# Patient Record
Sex: Female | Born: 1980 | Race: Black or African American | Hispanic: No | Marital: Single | Smoking: Never smoker
Health system: Southern US, Community
[De-identification: ages and names within clinical notes are randomized; demographics above are authoritative.]

## PROBLEM LIST (undated history)

## (undated) DIAGNOSIS — R350 Frequency of micturition: Secondary | ICD-10-CM

## (undated) DIAGNOSIS — M62838 Other muscle spasm: Secondary | ICD-10-CM

## (undated) DIAGNOSIS — Z87898 Personal history of other specified conditions: Secondary | ICD-10-CM

## (undated) DIAGNOSIS — K219 Gastro-esophageal reflux disease without esophagitis: Secondary | ICD-10-CM

## (undated) DIAGNOSIS — G4700 Insomnia, unspecified: Secondary | ICD-10-CM

## (undated) DIAGNOSIS — Z8709 Personal history of other diseases of the respiratory system: Secondary | ICD-10-CM

## (undated) DIAGNOSIS — M549 Dorsalgia, unspecified: Secondary | ICD-10-CM

## (undated) DIAGNOSIS — R531 Weakness: Secondary | ICD-10-CM

## (undated) DIAGNOSIS — R32 Unspecified urinary incontinence: Secondary | ICD-10-CM

## (undated) DIAGNOSIS — R351 Nocturia: Secondary | ICD-10-CM

## (undated) DIAGNOSIS — Z8669 Personal history of other diseases of the nervous system and sense organs: Secondary | ICD-10-CM

## (undated) DIAGNOSIS — G8929 Other chronic pain: Secondary | ICD-10-CM

---

## 2011-05-31 ENCOUNTER — Other Ambulatory Visit (HOSPITAL_COMMUNITY)
Admission: RE | Admit: 2011-05-31 | Discharge: 2011-05-31 | Disposition: A | Discharge status: Home / Self Care | Payer: Federal, State, Local not specified - PPO | Source: Ambulatory Visit | Attending: Obstetrics and Gynecology | Admitting: Obstetrics and Gynecology

## 2011-05-31 DIAGNOSIS — Z01419 Encounter for gynecological examination (general) (routine) without abnormal findings: Secondary | ICD-10-CM | POA: Insufficient documentation

## 2011-05-31 DIAGNOSIS — Z113 Encounter for screening for infections with a predominantly sexual mode of transmission: Secondary | ICD-10-CM | POA: Insufficient documentation

## 2011-05-31 DIAGNOSIS — Z1159 Encounter for screening for other viral diseases: Secondary | ICD-10-CM | POA: Insufficient documentation

## 2011-11-22 DIAGNOSIS — Z8709 Personal history of other diseases of the respiratory system: Secondary | ICD-10-CM

## 2011-11-22 HISTORY — DX: Personal history of other diseases of the respiratory system: Z87.09

## 2012-04-16 ENCOUNTER — Encounter: Payer: Self-pay | Admitting: Emergency Medicine

## 2012-04-16 ENCOUNTER — Emergency Department (INDEPENDENT_AMBULATORY_CARE_PROVIDER_SITE_OTHER)
Admission: EM | Admit: 2012-04-16 | Discharge: 2012-04-16 | Disposition: A | Discharge status: Home / Self Care | Payer: Federal, State, Local not specified - PPO | Source: Home / Self Care | Attending: Family Medicine | Admitting: Family Medicine

## 2012-04-16 DIAGNOSIS — H6690 Otitis media, unspecified, unspecified ear: Secondary | ICD-10-CM

## 2012-04-16 DIAGNOSIS — H6692 Otitis media, unspecified, left ear: Secondary | ICD-10-CM

## 2012-04-16 DIAGNOSIS — J069 Acute upper respiratory infection, unspecified: Secondary | ICD-10-CM

## 2012-04-16 DIAGNOSIS — J029 Acute pharyngitis, unspecified: Secondary | ICD-10-CM

## 2012-04-16 HISTORY — DX: Gastro-esophageal reflux disease without esophagitis: K21.9

## 2012-04-16 MED ORDER — GUAIFENESIN-CODEINE 100-10 MG/5ML PO SYRP: 10.0000 mL | ORAL_SOLUTION | Freq: Every day | ORAL | Status: AC

## 2012-04-16 MED ORDER — CLARITHROMYCIN 500 MG PO TABS: 500.0000 mg | ORAL_TABLET | Freq: Two times a day (BID) | ORAL | Status: AC

## 2012-04-16 NOTE — ED Provider Notes (Signed)
History     CSN: [id number]  Arrival date & time 04/16/12  1478   First MD Initiated Contact with Patient 04/16/12 [phone]      Chief Complaint  Patient presents with  . Sore Throat  . Cough  . Otalgia      HPI Comments: Patient complains of approximately 5 day history of gradually progressive URI symptoms beginning with a mild sore throat (now persistent), followed by progressive nasal congestion.  A cough started about 3 days ago.  Complains of fatigue and initial myalgias.  Cough is now worse at night and generally  productive during the day.  There has been no pleuritic pain, shortness of breath, or wheezes.  Today she developed a left earache with decreased hearing in the left ear.  She states that she often coughs until she gags.  She believes that her last tetanus immunization was before 2002.  The history is provided by the patient.    Past Medical History  Diagnosis Date  . Acid reflux     History reviewed. No pertinent past surgical history.  Family History  Problem Relation Age of Onset  . Hypertension Father     History  Substance Use Topics  . Smoking status: Never Smoker   . Smokeless tobacco: Not on file  . Alcohol Use: No    OB History    Grav Para Term Preterm Abortions TAB SAB Ect Mult Living                  Review of Systems + sore throat + cough No pleuritic pain No wheezing + nasal congestion + post-nasal drainage No sinus pain/pressure No itchy/red eyes + left earache No hemoptysis No SOB No fever/chills No nausea No vomiting No abdominal pain No diarrhea No urinary symptoms No skin rashes + fatigue + myalgias + headache Used OTC meds without relief (Mucinex) Allergies  Review of patient's allergies indicates no known allergies.  Home Medications   Current Outpatient Rx  Name Route Sig Dispense Refill  . CLARITHROMYCIN 500 MG PO TABS Oral Take 1 tablet (500 mg total) by mouth 2 (two) times daily. 14 tablet 0  .  GUAIFENESIN-CODEINE 100-10 MG/5ML PO SYRP Oral Take 10 mLs by mouth at bedtime. for cough 120 mL 0    BP 148/82  Pulse 84  Temp(Src) 98.4 F (36.9 C) (Oral)  Resp 16  Ht 5\' 8"  (1.727 m)  Wt 227 lb (102.967 kg)  BMI 34.52 kg/m2  SpO2 100%  LMP 03/12/2012  Physical Exam Nursing notes and Vital Signs reviewed. Appearance:  Patient appears healthy, stated age, and in no acute distress.  Patient is obese (BMI 34.6) Eyes:  Pupils are equal, round, and reactive to light and accomodation.  Extraocular movement is intact.  Conjunctivae are not inflamed  Ears:  Canals normal.  Right tympanic membrane normal.  Left tympanic membrane red and bulging. Nose:  Moderately congested turbinates.  No sinus tenderness.   Pharynx:  Minimal erythema Neck:  Supple.  Slightly tender shotty anterior/posterior nodes are palpated bilaterally  Lungs:  Clear to auscultation.  Breath sounds are equal.  Heart:  Regular rate and rhythm without murmurs, rubs, or gallops.  Abdomen:  Nontender without masses or hepatosplenomegaly.  Bowel sounds are present.  No CVA or flank tenderness.  Extremities:  No edema.  No calf tenderness Skin:  No rash present.   ED Course  Procedures none   Labs Reviewed  POCT RAPID STREP A (OFFICE) negative  1. Left otitis media   2. Acute pharyngitis   3. Acute upper respiratory infections of unspecified site.  ? Pertussis      MDM  Begin Biaxin.  Cough suppressant at bedtime.  Take Mucinex D (guaifenesin with decongestant) twice daily for congestion (or take Mucinex plus Sudafed).  Increase fluid intake, rest. May use Afrin nasal spray (or generic oxymetazoline) twice daily for about 5 days.  Also recommend using saline nasal spray several times daily and saline nasal irrigation (AYR is a common brand) Stop all antihistamines for now, and other non-prescription cough/cold preparations. May take Ibuprofen 200mg , 4 tabs every 8 hours with food for sore throat, headache,  etc. Recommend a Tdap when well.  Followup with Family Doctor if not improved in one week.         Lattie Haw, MD 04/16/12 [phone]

## 2012-04-16 NOTE — ED Notes (Signed)
Has had congestion, cough, sore throat and left ear pain x 5 days. No Flu vaccination this season.

## 2012-04-16 NOTE — Discharge Instructions (Signed)
Take Mucinex D (guaifenesin with decongestant) twice daily for congestion (or take Mucinex plus Sudafed).  Increase fluid intake, rest. May use Afrin nasal spray (or generic oxymetazoline) twice daily for about 5 days.  Also recommend using saline nasal spray several times daily and saline nasal irrigation (AYR is a common brand) Stop all antihistamines for now, and other non-prescription cough/cold preparations. May take Ibuprofen 200mg , 4 tabs every 8 hours with food for sore throat, headache, etc. Recommend a Tdap when well.

## 2012-04-18 ENCOUNTER — Telehealth: Payer: Self-pay | Admitting: *Deleted

## 2012-04-18 ENCOUNTER — Telehealth: Payer: Self-pay

## 2012-04-18 NOTE — ED Notes (Signed)
Left a message on voice mail asking how patient is feeling and advising to call back with any questions or concerns.  

## 2012-04-18 NOTE — ED Notes (Signed)
Spoke to pt in regards to her previous phone call. Per dr Cathren Harsh use OTC monistat for vaginal d/c. He also recommends that she try to complete the ABT since her lip swelling is intermittent and not severe. Advised her she could take benadryl with the ABT if needed. If she worsens or fails to improve he recommends that she come in for a f/u visit tomorrow. Pt agrees.

## 2012-04-18 NOTE — ED Notes (Signed)
Pt called back c/o swelling on her upper lip and thick yellow/ white vaginal d/c. She believes this may be related to the Biaxin. She states that she is some better, still has a cough. [facility] [place].

## 2012-05-01 ENCOUNTER — Encounter: Payer: Self-pay | Admitting: Family Medicine

## 2012-05-01 ENCOUNTER — Ambulatory Visit (INDEPENDENT_AMBULATORY_CARE_PROVIDER_SITE_OTHER): Payer: Federal, State, Local not specified - PPO | Admitting: Family Medicine

## 2012-05-01 VITALS — BP 135/89 | HR 63 | Ht 68.0 in | Wt 231.0 lb

## 2012-05-01 DIAGNOSIS — M533 Sacrococcygeal disorders, not elsewhere classified: Secondary | ICD-10-CM

## 2012-05-01 DIAGNOSIS — M79609 Pain in unspecified limb: Secondary | ICD-10-CM

## 2012-05-01 DIAGNOSIS — J019 Acute sinusitis, unspecified: Secondary | ICD-10-CM

## 2012-05-01 DIAGNOSIS — M79604 Pain in right leg: Secondary | ICD-10-CM

## 2012-05-01 DIAGNOSIS — J40 Bronchitis, not specified as acute or chronic: Secondary | ICD-10-CM

## 2012-05-01 MED ORDER — AMOXICILLIN-POT CLAVULANATE 875-125 MG PO TABS: 1.0000 | ORAL_TABLET | Freq: Two times a day (BID) | ORAL | Status: AC

## 2012-05-01 MED ORDER — ORPHENADRINE CITRATE ER 100 MG PO TB12: 100.0000 mg | ORAL_TABLET | Freq: Two times a day (BID) | ORAL | Status: AC

## 2012-05-01 MED ORDER — HYDROCOD POLST-CHLORPHEN POLST 10-8 MG/5ML PO LQCR: 5.0000 mL | Freq: Two times a day (BID) | ORAL | Status: DC | PRN

## 2012-05-01 MED ORDER — FEXOFENADINE-PSEUDOEPHED ER 180-240 MG PO TB24: 1.0000 | ORAL_TABLET | Freq: Every day | ORAL | Status: AC

## 2012-05-01 MED ORDER — MELOXICAM 15 MG PO TABS: 15.0000 mg | ORAL_TABLET | Freq: Every day | ORAL | Status: AC

## 2012-05-01 NOTE — Patient Instructions (Signed)
Sciatica Sciatica is a weakness and/or changes in sensation (tingling, jolts, hot and cold, numbness) along the path the sciatic nerve travels. Irritation or damage to lumbar nerve roots is often also referred to as lumbar radiculopathy.  Lumbar radiculopathy (Sciatica) is the most common form of this problem. Radiculopathy can occur in any of the nerves coming out of the spinal cord. The problems caused depend on which nerves are involved. The sciatic nerve is the large nerve supplying the branches of nerves going from the hip to the toes. It often causes a numbness or weakness in the skin and/or muscles that the sciatic nerve serves. It also may cause symptoms (problems) of pain, burning, tingling, or electric shock-like feelings in the path of this nerve. This usually comes from injury to the fibers that make up the sciatic nerve. Some of these symptoms are low back pain and/or unpleasant feelings in the following areas:  From the mid-buttock down the back of the leg to the back of the knee.   And/or the outside of the calf and top of the foot.   And/or behind the inner ankle to the sole of the foot.  CAUSES   Herniated or slipped disc. Discs are the little cushions between the bones in the back.   Pressure by the piriformis muscle in the buttock on the sciatic nerve (Piriformis Syndrome).   Misalignment of the bones in the lower back and buttocks (Sacroiliac Joint Derangement).   Narrowing of the spinal canal that puts pressure on or pinches the fibers that make up the sciatic nerve.   A slipped vertebra that is out of line with those above or beneath it.   Abnormality of the nervous system itself so that nerve fibers do not transmit signals properly, especially to feet and calves (neuropathy).   Tumor (this is rare).  Your caregiver can usually determine the cause of your sciatica and begin the treatment most likely to help you. TREATMENT  Taking over-the-counter painkillers, physical  therapy, rest, exercise, spinal manipulation, and injections of anesthetics and/or steroids may be used. Surgery, acupuncture, and Yoga can also be effective. Mind over matter techniques, mental imagery, and changing factors such as your bed, chair, desk height, posture, and activities are other treatments that may be helpful. You and your caregiver can help determine what is best for you. With proper diagnosis, the cause of most sciatica can be identified and removed. Communication and cooperation between your caregiver and you is essential. If you are not successful immediately, do not be discouraged. With time, a proper treatment can be found that will make you comfortable. HOME CARE INSTRUCTIONS   If the pain is coming from a problem in the back, applying ice to that area for 15 to 20 minutes, 3 to 4 times per day while awake, may be helpful. Put the ice in a plastic bag. Place a towel between the bag of ice and your skin.   You may exercise or perform your usual activities if these do not aggravate your pain, or as suggested by your caregiver.   Only take over-the-counter or prescription medicines for pain, discomfort, or fever as directed by your caregiver.   If your caregiver has given you a follow-up appointment, it is very important to keep that appointment. Not keeping the appointment could result in a chronic or permanent injury, pain, and disability. If there is any problem keeping the appointment, you must call back to this facility for assistance.  SEEK IMMEDIATE MEDICAL CARE   IF:   You experience loss of control of bowel or bladder.   You have increasing weakness in the trunk, buttocks, or legs.   There is numbness in any areas from the hip down to the toes.   You have difficulty walking or keeping your balance.   You have any of the above, with fever or forceful vomiting.  Document Released: 11/01/2001 Document Revised: 10/27/2011 Document Reviewed: 06/20/2008 [facility] Patient  Information 2012 [place], [place].Bronchitis Bronchitis is the body's way of reacting to injury and/or infection (inflammation) of the bronchi. Bronchi are the air tubes that extend from the windpipe into the lungs. If the inflammation becomes severe, it may cause shortness of breath. CAUSES  Inflammation may be caused by:  A virus.   Germs (bacteria).   Dust.   Allergens.   Pollutants and many other irritants.  The cells lining the bronchial tree are covered with tiny hairs (cilia). These constantly beat upward, away from the lungs, toward the mouth. This keeps the lungs free of pollutants. When these cells become too irritated and are unable to do their job, mucus begins to develop. This causes the characteristic cough of bronchitis. The cough clears the lungs when the cilia are unable to do their job. Without either of these protective mechanisms, the mucus would settle in the lungs. Then you would develop pneumonia. Smoking is a common cause of bronchitis and can contribute to pneumonia. Stopping this habit is the single most important thing you can do to help yourself. TREATMENT   Your caregiver may prescribe an antibiotic if the cough is caused by bacteria. Also, medicines that open up your airways make it easier to breathe. Your caregiver may also recommend or prescribe an expectorant. It will loosen the mucus to be coughed up. Only take over-the-counter or prescription medicines for pain, discomfort, or fever as directed by your caregiver.   Removing whatever causes the problem (smoking, for example) is critical to preventing the problem from getting worse.   Cough suppressants may be prescribed for relief of cough symptoms.   Inhaled medicines may be prescribed to help with symptoms now and to help prevent problems from returning.   For those with recurrent (chronic) bronchitis, there may be a need for steroid medicines.  SEEK IMMEDIATE MEDICAL CARE IF:   During treatment, you  develop more pus-like mucus (purulent sputum).   You have a fever.   Your baby is older than 3 months with a rectal temperature of 102 F (38.9 C) or higher.   Your baby is 59 months old or younger with a rectal temperature of 100.4 F (38 C) or higher.   You become progressively more ill.   You have increased difficulty breathing, wheezing, or shortness of breath.  It is necessary to seek immediate medical care if you are elderly or sick from any other disease. MAKE SURE YOU:   Understand these instructions.   Will watch your condition.   Will get help right away if you are not doing well or get worse.  Document Released: 11/07/2005 Document Revised: 10/27/2011 Document Reviewed: 09/16/2008 [facility] Patient Information 2012 [place], [place].

## 2012-05-01 NOTE — Progress Notes (Signed)
Subjective:    Patient ID: Karina Cobb, female    DOB: 1981-09-22, 31 y.o.   MRN: [id number]  Leg Pain  The incident occurred 5 to 7 days ago (sharp pain down R leg). The incident occurred at work. There was no injury mechanism. The pain is present in the right thigh, right hip and right leg. The quality of the pain is described as stabbing and shooting. The pain is at a severity of 7/10. The pain is severe. The pain has been constant since onset. Pertinent negatives include no inability to bear weight, loss of motion, loss of sensation, muscle weakness, numbness or tingling. She reports no foreign bodies present. The symptoms are aggravated by weight bearing and movement. She has tried NSAIDs for the symptoms. The treatment provided mild relief.  Cough Chronicity: persistant. The current episode started 1 to 4 weeks ago (Started on May 23 was placed on Biaxin by the urgent care on the 27). The problem has been unchanged (All the symptoms have passed except for the dry cough that has been persistent). The problem occurs every few minutes. The cough is productive of sputum (rarely). Associated symptoms include nasal congestion. Pertinent negatives include no chest pain, chills, ear congestion, ear pain, fever, headaches, heartburn, hemoptysis, myalgias, postnasal drip, rash, rhinorrhea, sore throat, shortness of breath, sweats, weight loss or wheezing. Associated symptoms comments: Acid reflux. The symptoms are aggravated by dust and fumes. Risk factors for lung disease include occupational exposure. She has tried prescription cough suppressant for the symptoms. The treatment provided no relief. There is no history of asthma, bronchiectasis, bronchitis, COPD, emphysema, environmental allergies or pneumonia.      Review of Systems  Constitutional: Negative for fever, chills and weight loss.  HENT: Negative for ear pain, sore throat, rhinorrhea and postnasal drip.   Respiratory: Positive for cough.  Negative for hemoptysis, shortness of breath and wheezing.   Cardiovascular: Negative for chest pain.  Gastrointestinal: Negative for heartburn.  Musculoskeletal: Negative for myalgias.  Skin: Negative for rash.  Neurological: Negative for tingling, numbness and headaches.  Hematological: Negative for environmental allergies.  All other systems reviewed and are negative.      BP 135/89  Pulse 63  Ht 5\' 8"  (1.727 m)  Wt 231 lb (104.781 kg)  BMI 35.12 kg/m2  SpO2 96%  LMP 04/30/2012 Objective:   Physical Exam  Vitals reviewed. Constitutional: She is oriented to person, place, and time. She appears well-developed and well-nourished.  HENT:  Head: Normocephalic and atraumatic.  Right Ear: External ear normal.  Left Ear: External ear normal.  Eyes: Left eye exhibits discharge.  Neck: Normal range of motion. Neck supple. No tracheal deviation present. No thyromegaly present.  Cardiovascular: Normal rate.   Murmur heard. Pulmonary/Chest: Stridor present. She is in respiratory distress. She has no wheezes. She has rales. She exhibits tenderness.       Actively coughing  Abdominal: Bowel sounds are normal.  Musculoskeletal: Normal range of motion. She exhibits no edema and no tenderness.       Reflexes equal and. Muscle tone also is equal. No tenderness over the lumbar and sacral spine but there is tenderness over the mid to upper right iliosacral joint area. This tenderness reproduces the pain down her right leg.  Neurological: She is alert and oriented to person, place, and time. No cranial nerve deficit. Coordination normal.  Skin: Skin is warm and dry.  Psychiatric: She has a normal mood and affect. Her behavior is normal.  Assessment & Plan:  #1 bronchitis/sinusitis Augmentin 875 twice a day.,Allegra-D daily, Tussionex 1 teaspoon twice a day. #2 iliac sacral ileitis/sciatica. Patient explained why she's having the pain that goes down her right leg. We'll place on Norflex 1  tablet twice a day and Mobic 15 mg daily. If not improved in 2 weeks please return for reevaluation and possible sacral iliac injection. Return as needed for next yearly physical when necessary.

## 2012-05-22 ENCOUNTER — Encounter: Payer: Self-pay | Admitting: Family Medicine

## 2012-05-22 ENCOUNTER — Ambulatory Visit (INDEPENDENT_AMBULATORY_CARE_PROVIDER_SITE_OTHER): Payer: Federal, State, Local not specified - PPO

## 2012-05-22 ENCOUNTER — Ambulatory Visit (INDEPENDENT_AMBULATORY_CARE_PROVIDER_SITE_OTHER): Payer: Federal, State, Local not specified - PPO | Admitting: Family Medicine

## 2012-05-22 VITALS — BP 121/79 | HR 67 | Ht 68.0 in | Wt 232.0 lb

## 2012-05-22 DIAGNOSIS — R05 Cough: Secondary | ICD-10-CM

## 2012-05-22 DIAGNOSIS — J9801 Acute bronchospasm: Secondary | ICD-10-CM

## 2012-05-22 DIAGNOSIS — M533 Sacrococcygeal disorders, not elsewhere classified: Secondary | ICD-10-CM

## 2012-05-22 DIAGNOSIS — J40 Bronchitis, not specified as acute or chronic: Secondary | ICD-10-CM

## 2012-05-22 MED ORDER — HYDROCOD POLST-CHLORPHEN POLST 10-8 MG/5ML PO LQCR: 5.0000 mL | Freq: Two times a day (BID) | ORAL | Status: DC | PRN

## 2012-05-22 MED ORDER — ALBUTEROL SULFATE HFA 108 (90 BASE) MCG/ACT IN AERS: 2.0000 | INHALATION_SPRAY | RESPIRATORY_TRACT | Status: DC | PRN

## 2012-05-22 MED ORDER — BUDESONIDE-FORMOTEROL FUMARATE 160-4.5 MCG/ACT IN AERO: 2.0000 | INHALATION_SPRAY | Freq: Two times a day (BID) | RESPIRATORY_TRACT | Status: DC

## 2012-05-22 NOTE — Patient Instructions (Signed)
Bronchospasm, Adult  Bronchospasm means that there is a spasm or tightening of the airways going into the lungs. Because the airways go into a spasm and get smaller it makes breathing more difficult.  For reasons not completely known, workings (functions) of the airways designed to protect the lungs become over active. This causes the airways to become more sensitive to:  · Infection.  · Weather.  · Exercise.  · Irritants.  · Things that cause allergic reactions or allergies (allergens).  Frequent coughing or respiratory episodes should be checked for the cause. This condition may be made worse by exercise.  CAUSES   Inflammation is often the cause of this condition. Allergy, viral respiratory infections, or irritants in the air often cause this problem. Allergic reactions produce immediate and delayed responses. Late reactions may produce more serious inflammation. This may lead to increased reactivity of the airways. Sometimes this is inherited.  Some common triggers are:  · Allergies.  · Infection commonly triggers attacks. Antibiotics are not helpful for viral infections and usually do not help with attacks of bronchospasm.  · Exercise (running, etc.) can trigger an attack. Proper pre-exercise medications help most individuals participate in sports. Swimming is the least likely sport to cause problems.  · Irritants (for example, pollution, cigarette smoke, strong odors, aerosol sprays, paint fumes, etc.) may trigger attacks. You cannot smoke and do not allow smoking in your home. This is absolutely necessary. Show this instruction to mates, relatives and significant others that may not agree with you.  · Weather changes may cause lung problems but moving around trying to find an ideal climate does not seem to be overly helpful. Winds increase molds and pollens in the air. Rain refreshes the air by washing irritants out. Cold air may cause irritation.  · Emotional problems do not cause lung problems but can  trigger attacks.  SYMPTOMS   Wheezing is the most common symptom. Frequent coughing (with or without exercise and or crying) and repeated respiratory infections are all early warning signs of bronchospasm. Chest tightness and shortness of breath are other symptoms.  DIAGNOSIS   Early hidden bronchospasm may go for long periods of time without being detected. This is especially true if wheezing cannot be detected by your caregiver. Lung (pulmonary) function studies may help with diagnosis in these cases.  HOME CARE INSTRUCTIONS   · It is necessary to remain calm during an attack. Try to relax and breathe more slowly. During this time medications may be given. If any breathing problems seem to be getting worse and are unresponsive to treatment seek immediate medical care.  · If you have severe breathing difficulty or have had a life threatening attack it is probably a good idea for you to learn how to give adrenaline (epi-pen) or use an anaphylaxis kit. Your caregiver can help you with this. These are the same kits carried by people who have severe allergic reactions. This is especially important if you do not have readily accessible medical care.  · With any severe breathing problems where epinephrine (adrenaline) has been given at home call 911 immediately as the delayed reaction may be even more severe.  SEEK MEDICAL CARE IF:   · There is wheezing and shortness of breath, even if medications are given to prevent attacks.  · An oral temperature above 102° F (38.9° C) develops.  · There are muscle aches, chest pain, or thickening of sputum.  · The sputum changes from clear or white to yellow, green,   gray, or bloody.  · There are problems that may be related to the medicine you are given, such as a rash, itching, swelling, or trouble breathing.  SEEK IMMEDIATE MEDICAL CARE IF:   · The usual medicines do not stop your wheezing, or there is increased coughing.  · You have increased difficulty breathing.  MAKE SURE YOU:    · Understand these instructions.  · Will watch your condition.  · Will get help right away if you are not doing well or get worse.  Document Released: 11/10/2003 Document Revised: 10/27/2011 Document Reviewed: 06/25/2008  [employer]® Patient Information ©2012 [employer].  Cough, Adult   A cough is a reflex that helps clear your throat and airways. It can help heal the body or may be a reaction to an irritated airway. A cough may only last 2 or 3 weeks (acute) or may last more than 8 weeks (chronic).   CAUSES  Acute cough:  · Viral or bacterial infections.  Chronic cough:  · Infections.  · Allergies.  · Asthma.  · Post-nasal drip.  · Smoking.  · Heartburn or acid reflux.  · Some medicines.  · Chronic lung problems (COPD).  · Cancer.  SYMPTOMS   · Cough.  · Fever.  · Chest pain.  · Increased breathing rate.  · High-pitched whistling sound when breathing (wheezing).  · Colored mucus that you cough up (sputum).  TREATMENT   · A bacterial cough may be treated with antibiotic medicine.  · A viral cough must run its course and will not respond to antibiotics.  · Your caregiver may recommend other treatments if you have a chronic cough.  HOME CARE INSTRUCTIONS   · Only take over-the-counter or prescription medicines for pain, discomfort, or fever as directed by your caregiver. Use cough suppressants only as directed by your caregiver.  · Use a cold steam vaporizer or humidifier in your bedroom or home to help loosen secretions.  · Sleep in a semi-upright position if your cough is worse at night.  · Rest as needed.  · Stop smoking if you smoke.  SEEK IMMEDIATE MEDICAL CARE IF:   · You have pus in your sputum.  · Your cough starts to worsen.  · You cannot control your cough with suppressants and are losing sleep.  · You begin coughing up blood.  · You have difficulty breathing.  · You develop pain which is getting worse or is uncontrolled with medicine.  · You have a fever.  MAKE SURE YOU:   · Understand these  instructions.  · Will watch your condition.  · Will get help right away if you are not doing well or get worse.  Document Released: 05/06/2011 Document Revised: 10/27/2011 Document Reviewed: 05/06/2011  [employer]® Patient Information ©2012 [employer].

## 2012-05-22 NOTE — Progress Notes (Signed)
  Subjective:    Patient ID: Karina Cobb, female    DOB: 01-24-1981, 31 y.o.   MRN: [id number]  Back Pain This is a recurrent problem. The current episode started more than 1 month ago. The problem has been gradually improving since onset. The pain is present in the sacro-iliac. The quality of the pain is described as shooting. The pain radiates to the right thigh. The pain is moderate. The pain is worse during the night. The symptoms are aggravated by standing. Stiffness is present: sometimes at work. Pertinent negatives include no chest pain or weight loss. She has tried analgesics for the symptoms. The treatment provided moderate relief.  URI  Associated symptoms include coughing. Pertinent negatives include no chest pain, rash, rhinorrhea, sore throat or wheezing.  Cough This is a recurrent problem. The current episode started more than 1 month ago (has ben ongoing for over a month). The problem has been waxing and waning. The cough is non-productive (use to be productive). Pertinent negatives include no chest pain, chills, ear congestion, nasal congestion, rash, rhinorrhea, sore throat, shortness of breath, sweats, weight loss or wheezing. The symptoms are aggravated by fumes, dust, animals and cold air. Risk factors for lung disease include occupational exposure. She has tried prescription cough suppressant (ran out of tussionex would like more) for the symptoms. Her past medical history is significant for bronchitis. There is no history of asthma, bronchiectasis, COPD, environmental allergies or pneumonia.      Review of Systems  Constitutional: Negative for chills and weight loss.  HENT: Negative for sore throat and rhinorrhea.   Respiratory: Positive for cough. Negative for shortness of breath and wheezing.   Cardiovascular: Negative for chest pain.  Musculoskeletal: Positive for back pain.  Skin: Negative for rash.  Hematological: Negative for environmental allergies.      BP 121/79   Pulse 67  Ht 5\' 8"  (1.727 m)  Wt 232 lb (105.235 kg)  BMI 35.28 kg/m2  SpO2 99%  LMP 04/30/2012 Objective:   Physical Exam  Constitutional: She is oriented to person, place, and time. She appears well-developed and well-nourished.  Cardiovascular: Normal rate, regular rhythm and normal heart sounds.   Pulmonary/Chest: Effort normal and breath sounds normal. No respiratory distress. She has no wheezes.  Musculoskeletal: Normal range of motion. She exhibits tenderness.        only mild tenderness over the right iliac sacral area. Marked improvement from the last visit  Neurological: She is alert and oriented to person, place, and time.  Skin: Skin is warm and dry. No erythema.  Psychiatric: She has a normal mood and affect. Her behavior is normal.      Assessment & Plan:    #1 cough/bronchospasm secondary from infection. We will obtain a chest x-ray to make sure no other significant pathology is occurring. Placed on Symbicort inhaler 2 puffs twice a day for one month and then reduce to 1 puff twice a day. Follow up in 6 weeks. We'll also renew Tussionex. She has been on 2 rounds of antibiotics and with the clearing of her sputum we'll hold off on any more.  #2 back pain back pain has continued to improve. Probably greater than the cough. Continue the Mobic, and the Norflex and a start using them when necessary at this time.

## 2012-07-03 ENCOUNTER — Ambulatory Visit: Payer: Federal, State, Local not specified - PPO | Admitting: Family Medicine

## 2012-07-12 ENCOUNTER — Ambulatory Visit: Payer: Federal, State, Local not specified - PPO | Admitting: Family Medicine

## 2015-12-23 HISTORY — PX: MOUTH SURGERY: SHX715

## 2016-01-31 ENCOUNTER — Encounter (HOSPITAL_COMMUNITY): Payer: Self-pay | Admitting: Family Medicine

## 2016-01-31 ENCOUNTER — Emergency Department (HOSPITAL_COMMUNITY): Payer: Federal, State, Local not specified - PPO

## 2016-01-31 ENCOUNTER — Emergency Department (HOSPITAL_COMMUNITY)
Admission: EM | Admit: 2016-01-31 | Discharge: 2016-01-31 | Disposition: A | Discharge status: Home / Self Care | Payer: Federal, State, Local not specified - PPO | Attending: Emergency Medicine | Admitting: Emergency Medicine

## 2016-01-31 DIAGNOSIS — Z8719 Personal history of other diseases of the digestive system: Secondary | ICD-10-CM | POA: Diagnosis not present

## 2016-01-31 DIAGNOSIS — M545 Low back pain: Secondary | ICD-10-CM | POA: Diagnosis present

## 2016-01-31 DIAGNOSIS — R200 Anesthesia of skin: Secondary | ICD-10-CM

## 2016-01-31 DIAGNOSIS — M5126 Other intervertebral disc displacement, lumbar region: Secondary | ICD-10-CM | POA: Insufficient documentation

## 2016-01-31 DIAGNOSIS — Z3202 Encounter for pregnancy test, result negative: Secondary | ICD-10-CM | POA: Insufficient documentation

## 2016-01-31 DIAGNOSIS — Z79899 Other long term (current) drug therapy: Secondary | ICD-10-CM | POA: Diagnosis not present

## 2016-01-31 DIAGNOSIS — M549 Dorsalgia, unspecified: Secondary | ICD-10-CM

## 2016-01-31 LAB — URINALYSIS, ROUTINE W REFLEX MICROSCOPIC
BILIRUBIN URINE: NEGATIVE
Glucose, UA: NEGATIVE mg/dL
HGB URINE DIPSTICK: NEGATIVE
KETONES UR: NEGATIVE mg/dL
Leukocytes, UA: NEGATIVE
Nitrite: NEGATIVE
PH: 7.5 (ref 5.0–8.0)
Protein, ur: NEGATIVE mg/dL
SPECIFIC GRAVITY, URINE: 1.014 (ref 1.005–1.030)

## 2016-01-31 LAB — POC URINE PREG, ED: Preg Test, Ur: NEGATIVE

## 2016-01-31 MED ORDER — METHOCARBAMOL 500 MG PO TABS: 500.0000 mg | ORAL_TABLET | Freq: Two times a day (BID) | ORAL | Status: AC

## 2016-01-31 MED ORDER — ONDANSETRON 4 MG PO TBDP
8.0000 mg | ORAL_TABLET | Freq: Once | ORAL | Status: AC
  Administered 2016-01-31: 8 mg via ORAL
  Filled 2016-01-31: qty 2

## 2016-01-31 MED ORDER — PREDNISONE 10 MG PO TABS: ORAL_TABLET | ORAL | Status: AC

## 2016-01-31 MED ORDER — KETOROLAC TROMETHAMINE 60 MG/2ML IM SOLN
60.0000 mg | Freq: Once | INTRAMUSCULAR | Status: AC
  Administered 2016-01-31: 60 mg via INTRAMUSCULAR
  Filled 2016-01-31: qty 2

## 2016-01-31 MED ORDER — OXYCODONE-ACETAMINOPHEN 5-325 MG PO TABS
1.0000 | ORAL_TABLET | Freq: Once | ORAL | Status: AC
  Administered 2016-01-31: 1 via ORAL
  Filled 2016-01-31: qty 1

## 2016-01-31 MED ORDER — OXYCODONE-ACETAMINOPHEN 5-325 MG PO TABS: 1.0000 | ORAL_TABLET | ORAL | Status: DC | PRN

## 2016-01-31 MED ORDER — DEXAMETHASONE SODIUM PHOSPHATE 10 MG/ML IJ SOLN
10.0000 mg | Freq: Once | INTRAMUSCULAR | Status: AC
  Administered 2016-01-31: 10 mg via INTRAMUSCULAR
  Filled 2016-01-31: qty 1

## 2016-01-31 MED ORDER — DIAZEPAM 5 MG PO TABS
5.0000 mg | ORAL_TABLET | Freq: Once | ORAL | Status: AC
  Administered 2016-01-31: 5 mg via ORAL
  Filled 2016-01-31: qty 1

## 2016-01-31 NOTE — Discharge Instructions (Signed)
Take percocet as prescribed as needed for severe pain. You can take up to 2 tab every 6 hrs or 1 tab every 4 hrs. Take robaxin for muscle spasms. Take prednisone as prescribed next dose tomorrow, take until all gone. Return to ER if weakness in foot, trouble urinating, loss of bowel control. Call Dr. Lonie Peakram's office tomorrow, he will see you around 2pm tomorrow.    Herniated Disk A herniated disk occurs when a disk in your spine bulges out too far. This condition is also called a ruptured disk or slipped disk. Your spine (backbone) is made up of bones called vertebrae. Between each pair of vertebrae is an oval disk with a soft, spongy center that acts as a shock absorber when you move. The spongy center is surrounded by a tough outer ring. When you have a herniated disk, the spongy center of the disk bulges out or ruptures through the outer ring. A herniated disk can press on a nerve between your vertebrae and cause pain. A herniated disk can occur anywhere in your back or neck area, but the lower back is the most common spot. CAUSES  In many cases, a herniated disk occurs just from getting older. As you age, the spongy insides of your disks tend to shrink and dry out. A herniated disk can result from gradual wear and tear. Injury or sudden strain can also cause a herniated disk.  RISK FACTORS Aging is the main risk factor for a herniated disk. Other risk factors include:  Being a man between the ages of 4130 and 8050 years.  Having a job that requires heavy lifting, bending, or twisting.  Having a job that requires long hours of driving.  Not getting enough exercise.  Being overweight.  Smoking. SIGNS AND SYMPTOMS  Signs and symptoms depend on which disk is herniated.  For a herniated disk in the lower back, you may have sharp pain in:  One part of your leg, hip, or buttocks.  The back of your calf.  The top or sole of your foot (sciatica).   For a herniated disk in the neck, you may  feel pain:  When you move your neck.  Near or over your shoulder blade.  That moves to your upper arm, forearm, or fingers.   You may also have muscle weakness. It may be hard to:  Lift your leg or arm.  Stand on your toes.  Squeeze tightly with one of your hands.  Other symptoms can include:  Numbness or tingling in the affected areas of your body.  Loss of bladder or bowel control. This is a rare but serious sign of a severe herniated disk in the lower back. DIAGNOSIS  Your health care provider will do a physical exam. During this exam, you may have to move certain body parts or assume various positions. For example, your health care provider may do the straight-leg test. This is a good way to test for a herniated disk in your lower back. In this test, the health care provider lifts your leg while you lie on your back. This is to see if you feel pain down your leg. Your health care provider will also check for numbness or loss of feeling.  Your health care provider will also check your:  Reflexes.  Muscle strength.  Posture.  Other tests may be done to help in making a diagnosis. These may include:  An X-ray of the spine to rule out other causes of back pain.  Other imaging studies, such as an MRI or CT scan. This is to check whether the herniated disk is pressing on your spinal canal.  Electromyography (EMG). This test checks the nerves that control muscles. It is sometimes used to identify the specific area of nerve involvement.  TREATMENT  In many cases, herniated disk symptoms go away over a period of days or weeks. You will most likely be free of symptoms in 3-4 months. Treatment may include the following:  The initial treatment for a herniated disk is ashort period of rest.  Bed rest is often limited to 1 or 2 days. Resting for too long delays recovery.  If you have a herniated disk in your lower back, you should avoid sitting as much as possible because  sitting increases pressure on the disk.  Medicines. These may include:   Nonsteroidal anti-inflammatory drugs (NSAIDs).  Muscle relaxants for back spasms.  Narcotic pain medicine if your pain is very bad.   Steroid injections. You may need these along the involved nerve root to help control pain. The steroid is injected in the area of the herniated disk. It helps by reducing swelling around the disk.  Physical therapy. This may include exercises to strengthen the muscles that help support your spine.   You may need surgery if other treatments do not work.  HOME CARE INSTRUCTIONS Follow all your health care provider's instructions. These may include:  Take all medicines as directed by your health care provider.  Rest for 2 days and then start moving.  Do not sit or stand for long periods of time.  Maintain good posture when sitting and standing.  Avoid movements that cause pain, such as bending or lifting.  When you are able to start lifting things again:  [place] with your knees.  Keep your back straight.  Hold heavy objects close to your body.  If you are overweight, ask your health care provider to help you start a weight-loss program.  When you are able to start exercising, ask your health care provider how much and what type of exercise is best for you.  Work with a physical therapist on stretching and strengthening exercises for your back.  Do not wear high-heeled shoes.  Do not sleep on your belly.  Do not smoke.  Keep all follow-up visits as directed by your health care provider. SEEK MEDICAL CARE IF:  You have back or neck pain that is not getting better after 4 weeks.  You have very bad pain in your back or neck.  You develop numbness, tingling, or weakness along with pain. SEEK IMMEDIATE MEDICAL CARE IF:   You have numbness, tingling, or weakness that makes you unable to use your arms or legs.  You lose control of your bladder or bowels.  You  have dizziness or fainting.  You have shortness of breath.  MAKE SURE YOU:   Understand these instructions.  Will watch your condition.  Will get help right away if you are not doing well or get worse.   This information is not intended to replace advice given to you by your health care provider. Make sure you discuss any questions you have with your health care provider.   Document Released: 11/04/2000 Document Revised: 11/28/2014 Document Reviewed: 10/11/2013 Elsevier Interactive Patient Education [employer]

## 2016-01-31 NOTE — ED Notes (Signed)
Declined W/C at D/C and was escorted to lobby by RN. 

## 2016-01-31 NOTE — ED Provider Notes (Signed)
CSN: [id number]     Arrival date & time 01/31/16  0911 History  By signing my name below, I, Soijett Blue, attest that this documentation has been prepared under the direction and in the presence of Jaynie Crumbleatyana Reata Petrov, [employer]F CorporationPA-C Electronically Signed: Soijett Blue, ED Scribe. 01/31/2016. 9:48 AM.   Chief Complaint  Patient presents with  . Back Pain      The history is provided by the patient. No language interpreter was used.    HPI Comments: Adalberto IllMonica Odette is a 35 y.o. female who presents to the Emergency Department complaining of lower back pain onset 1 week worsening today. She notes that her lower back pain worsened when she picked something up this morning and a sharp pain shot down her left leg. Pt reports that she used to have sciatic nerve pain on her right side that has since resolved and is similar to her symptoms today. Pt lower back pain is worsened with ambulation, movement, and position change. Pt denies recent injury or trauma. She states that she is having associated symptoms of tingling to bottom of left foot. She states that she has not tried any medications for the relief for her symptoms. Pt denies gait problem, dysuria, hematuria, difficulty urinating, frequency, urgency, bowel/bladder incontinence, and any other symptoms. Pt doesn't have a PCP at this time. Pt denies pregnancy at this time. Denies allergies to medications.   Past Medical History  Diagnosis Date  . Acid reflux    History reviewed. No pertinent past surgical history. Family History  Problem Relation Age of Onset  . Hypertension Father    Social History  Substance Use Topics  . Smoking status: Never Smoker   . Smokeless tobacco: None  . Alcohol Use: No   OB History    No data available     Review of Systems  Gastrointestinal:       No bowel incontinence  Genitourinary: Negative for dysuria, urgency, frequency, hematuria and difficulty urinating.       No bladder incontinence  Musculoskeletal:  Positive for back pain. Negative for gait problem.  Skin: Negative for color change, rash and wound.  Neurological:       Tingling in left foot      Allergies  Review of patient's allergies indicates no known allergies.  Home Medications   Prior to Admission medications   Medication Sig Start Date End Date Taking? Authorizing Provider  albuterol (VENTOLIN HFA) 108 (90 BASE) MCG/ACT inhaler Inhale 2 puffs into the lungs every 4 (four) hours as needed for wheezing. 05/22/12 05/22/13  Hassan RowanEugene Wade, MD  budesonide-formoterol [facility](SYMBICORT) 160-4.5 MCG/ACT inhaler Inhale 2 puffs into the lungs 2 (two) times daily. 05/22/12 05/22/13  Hassan RowanEugene Wade, MD  chlorpheniramine-HYDROcodone [facility](TUSSIONEX PENNKINETIC ER) 10-8 MG/5ML LQCR Take 5 mLs by mouth every 12 (twelve) hours as needed. 05/22/12   Hassan RowanEugene Wade, MD   BP 134/80 mmHg  Pulse 74  Temp(Src) 98.1 F (36.7 C) (Oral)  Resp 20  SpO2 97%  LMP 01/29/2016 Physical Exam  Constitutional: She is oriented to person, place, and time. She appears well-developed and well-nourished. No distress.  HENT:  Head: Normocephalic and atraumatic.  Eyes: EOM are normal.  Neck: Neck supple.  Cardiovascular: Normal rate, regular rhythm and normal heart sounds.   Pulmonary/Chest: Effort normal and breath sounds normal. No respiratory distress. She has no wheezes. She has no rales.  Abdominal: Soft. Bowel sounds are normal. She exhibits no distension. There is no tenderness.  Musculoskeletal: Normal range of motion.  Tender to palpation over the midline lumbar spine and left SI joint. Pain with left straight leg raise.  Neurological: She is alert and oriented to person, place, and time.  5/5 and equal lower extremity strength. 2+ and equal patellar reflexes bilaterally. Pt able to dorsiflex bilateral toes and feet with good strength against resistance. Equal sensation bilaterally over thighs and lower legs.   Skin: Skin is warm and dry.  Psychiatric: She has a normal mood  and affect. Her behavior is normal.  Nursing note and vitals reviewed.   ED Course  Procedures (including critical care time) DIAGNOSTIC STUDIES: Oxygen Saturation is 97% on RA, nl by my interpretation.    COORDINATION OF CARE: 9:45 AM Discussed treatment plan with pt at bedside which includes UA, toradol injection, percocet, zofran, lumbar spine xray, and pt agreed to plan.    Labs Review Labs Reviewed  URINALYSIS, ROUTINE W REFLEX MICROSCOPIC (NOT AT [facility])  POC URINE PREG, ED    Imaging Review Dg Lumbar Spine Complete  01/31/2016  CLINICAL DATA:  35 year old female complaining of low back pain for the past week. EXAM: LUMBAR SPINE - COMPLETE 4+ VIEW COMPARISON:  No priors. FINDINGS: There is no evidence of lumbar spine fracture. Alignment is normal. Intervertebral disc spaces are maintained. IMPRESSION: Negative. Electronically Signed   By: Trudie Reed M.D.   On: 01/31/2016 11:27   Mr Lumbar Spine Wo Contrast  01/31/2016  CLINICAL DATA:  Foot numbness. Low back pain radiating into the anterior left pelvis/ groin, left buttock, left calf, and left foot. Numbness of the second through fifth digits. EXAM: MRI LUMBAR SPINE WITHOUT CONTRAST TECHNIQUE: Multiplanar, multisequence MR imaging of the lumbar spine was performed. No intravenous contrast was administered. COMPARISON:  Lumbar spine radiographs 01/31/2016 FINDINGS: There is mild straightening of the normal lumbar lordosis with trace retrolisthesis of L5 on S1. Vertebral body heights are preserved. Disc desiccation and mild disc space height loss are present from L3-4 to L5-S1. Moderate degenerative endplate changes are noted at L4-5 including mild degenerative edema. The conus medullaris is normal in signal and terminates at T12-L1. Paraspinal soft tissues are unremarkable. T12-L1 and L1-2: Small volume intermediate T1 and low T2 signal material in the midline ventral epidural space behind the L1 vertebral body consistent with a  disc extrusion. It is not clear whether this arises from the T12-L1 or L1-2 disc. There is no associated stenosis or neural impingement. L2-3:  Negative. L3-4:  Mild disc bulging without stenosis. L4-5: Disc bulging asymmetric to the right, disc space height loss, and mild facet hypertrophy result in mild right neural foraminal stenosis without spinal stenosis. L5-S1: Large left paracentral disc extrusion results in severe left lateral recess with complete effacement of the left half of the thecal sac. Left-sided nerve roots are displaced posteriorly and medially. The left S1 nerve roots may be affected to the greatest degree. Disc extends leftward into the neural foramen and results in moderate to severe foraminal stenosis, potentially affecting the left L5 nerve. There is mild bilateral facet arthrosis. IMPRESSION: 1. 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 2. 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 3. Small disc extrusion behind the L1 vertebral body. It is uncertain whether T12-L1 or L1-2 represents the parent disc. Electronically Signed   By: Sebastian Ache M.D.   On: 01/31/2016 14:03   I have personally reviewed and evaluated these images and lab results  as part of my medical decision-making.   EKG Interpretation None      MDM   Final diagnoses:  None   Patient with lower back pain radiating into the left foot. She reports numbness sensation in the foot, however she still has sensation intact and is able to dorsiflex her foot and her great toe. She is vascularly intact. Will get x-ray, will try Toradol and Percocet for pain management.   No relief of pain with Percocet and Toradol. Her x-ray did not show any findings. Urinalysis is negative. Will try Decadron and Valium and reassess.   Continues to have pain and numbness to the left foot despite medications. Discussed with  Dr. Freida Busman, MRI ordered. No evidence of cauda equina.    MRI as described above. I discussed with Dr. Wynetta Emery, neurosurgery, who will see patient tomorrow around 2 PM. He asked me to have her call in the morning and get that appointment scheduled. Will start on steroids at home, Percocet for pain, Robaxin for spasms. I discussed with patient her family strict return precautions. Pt agrees to plan and voiced understanding.   Filed Vitals:   01/31/16 0919 01/31/16 1437  BP: 134/80 146/86  Pulse: 74 59  Temp: 98.1 F (36.7 C) 98.2 F (36.8 C)  TempSrc: Oral Oral  Resp: 20 16  SpO2: 97% 100%   I personally performed the services described in this documentation, which was scribed in my presence. The recorded information has been reviewed and is accurate.    Jaynie Crumble, PA-C 01/31/16 1439  Lorre Nick, MD 01/31/16 [phone]

## 2016-01-31 NOTE — ED Notes (Signed)
Pt here for 3 days of lower back pain and worsening today when she picked something up. sts radiating down her right leg into foot.

## 2016-02-01 ENCOUNTER — Telehealth (HOSPITAL_BASED_OUTPATIENT_CLINIC_OR_DEPARTMENT_OTHER): Payer: Self-pay | Admitting: Emergency Medicine

## 2016-02-05 ENCOUNTER — Ambulatory Visit (HOSPITAL_COMMUNITY): Payer: Federal, State, Local not specified - PPO | Admitting: Anesthesiology

## 2016-02-05 ENCOUNTER — Ambulatory Visit (HOSPITAL_COMMUNITY)
Admission: RE | Admit: 2016-02-05 | Discharge: 2016-02-06 | Disposition: A | Discharge status: Home / Self Care | Payer: Federal, State, Local not specified - PPO | Source: Ambulatory Visit | Attending: Neurosurgery | Admitting: Neurosurgery

## 2016-02-05 ENCOUNTER — Other Ambulatory Visit: Payer: Self-pay | Admitting: Neurosurgery

## 2016-02-05 ENCOUNTER — Encounter (HOSPITAL_COMMUNITY)
Admission: RE | Disposition: A | Discharge status: Home / Self Care | Payer: Self-pay | Source: Ambulatory Visit | Attending: Neurosurgery

## 2016-02-05 ENCOUNTER — Ambulatory Visit (HOSPITAL_COMMUNITY): Payer: Federal, State, Local not specified - PPO

## 2016-02-05 ENCOUNTER — Encounter (HOSPITAL_COMMUNITY): Payer: Self-pay | Admitting: *Deleted

## 2016-02-05 DIAGNOSIS — G834 Cauda equina syndrome: Secondary | ICD-10-CM | POA: Diagnosis not present

## 2016-02-05 DIAGNOSIS — R262 Difficulty in walking, not elsewhere classified: Secondary | ICD-10-CM | POA: Insufficient documentation

## 2016-02-05 DIAGNOSIS — M5117 Intervertebral disc disorders with radiculopathy, lumbosacral region: Secondary | ICD-10-CM | POA: Insufficient documentation

## 2016-02-05 DIAGNOSIS — M5126 Other intervertebral disc displacement, lumbar region: Secondary | ICD-10-CM | POA: Diagnosis present

## 2016-02-05 DIAGNOSIS — G8929 Other chronic pain: Secondary | ICD-10-CM | POA: Insufficient documentation

## 2016-02-05 DIAGNOSIS — Z419 Encounter for procedure for purposes other than remedying health state, unspecified: Secondary | ICD-10-CM

## 2016-02-05 HISTORY — DX: Other chronic pain: G89.29

## 2016-02-05 HISTORY — DX: Frequency of micturition: R35.0

## 2016-02-05 HISTORY — DX: Weakness: R53.1

## 2016-02-05 HISTORY — DX: Personal history of other diseases of the nervous system and sense organs: Z86.69

## 2016-02-05 HISTORY — DX: Personal history of other specified conditions: Z87.898

## 2016-02-05 HISTORY — DX: Insomnia, unspecified: G47.00

## 2016-02-05 HISTORY — DX: Other muscle spasm: M62.838

## 2016-02-05 HISTORY — DX: Unspecified urinary incontinence: R32

## 2016-02-05 HISTORY — DX: Dorsalgia, unspecified: M54.9

## 2016-02-05 HISTORY — PX: LUMBAR LAMINECTOMY/DECOMPRESSION MICRODISCECTOMY: SHX5026

## 2016-02-05 HISTORY — DX: Personal history of other diseases of the respiratory system: Z87.09

## 2016-02-05 HISTORY — DX: Nocturia: R35.1

## 2016-02-05 LAB — SURGICAL PCR SCREEN
MRSA, PCR: NEGATIVE
Staphylococcus aureus: NEGATIVE

## 2016-02-05 LAB — CBC
HCT: 42 % (ref 36.0–46.0)
Hemoglobin: 13.6 g/dL (ref 12.0–15.0)
MCH: 28 pg (ref 26.0–34.0)
MCHC: 32.4 g/dL (ref 30.0–36.0)
MCV: 86.4 fL (ref 78.0–100.0)
Platelets: 228 K/uL (ref 150–400)
RBC: 4.86 MIL/uL (ref 3.87–5.11)
RDW: 14.1 % (ref 11.5–15.5)
WBC: 7.3 K/uL (ref 4.0–10.5)

## 2016-02-05 LAB — BASIC METABOLIC PANEL
Anion gap: 11 (ref 5–15)
BUN: 15 mg/dL (ref 6–20)
CALCIUM: 9.1 mg/dL (ref 8.9–10.3)
CHLORIDE: 101 mmol/L (ref 101–111)
CO2: 28 mmol/L (ref 22–32)
CREATININE: 0.94 mg/dL (ref 0.44–1.00)
GFR calc Af Amer: >60 mL/min (ref >60)
GFR calc non Af Amer: >60 mL/min (ref >60)
Glucose, Bld: 85 mg/dL (ref 65–99)
Potassium: 4.6 mmol/L (ref 3.5–5.1)
SODIUM: 140 mmol/L (ref 135–145)

## 2016-02-05 SURGERY — LUMBAR LAMINECTOMY/DECOMPRESSION MICRODISCECTOMY 1 LEVEL
Anesthesia: General | Site: Back | Laterality: Left

## 2016-02-05 MED ORDER — MIDAZOLAM HCL 5 MG/5ML IJ SOLN
INTRAMUSCULAR | Status: DC | PRN
  Administered 2016-02-05: 2 mg via INTRAVENOUS

## 2016-02-05 MED ORDER — LACTATED RINGERS IV SOLN
INTRAVENOUS | Status: DC
  Administered 2016-02-05 (×2): via INTRAVENOUS

## 2016-02-05 MED ORDER — MENTHOL 3 MG MT LOZG
1.0000 | LOZENGE | OROMUCOSAL | Status: DC | PRN
  Filled 2016-02-05: qty 9

## 2016-02-05 MED ORDER — ONDANSETRON HCL 4 MG/2ML IJ SOLN
INTRAMUSCULAR | Status: DC | PRN
Start: 2016-02-05 — End: 2016-02-05
  Administered 2016-02-05: 4 mg via INTRAVENOUS

## 2016-02-05 MED ORDER — MEPERIDINE HCL 25 MG/ML IJ SOLN: 6.2500 mg | INTRAMUSCULAR | Status: DC | PRN

## 2016-02-05 MED ORDER — ACETAMINOPHEN 325 MG PO TABS: 650.0000 mg | ORAL_TABLET | ORAL | Status: DC | PRN

## 2016-02-05 MED ORDER — PHENOL 1.4 % MT LIQD: 1.0000 | OROMUCOSAL | Status: DC | PRN

## 2016-02-05 MED ORDER — MIDAZOLAM HCL 2 MG/2ML IJ SOLN: 0.5000 mg | Freq: Once | INTRAMUSCULAR | Status: DC | PRN

## 2016-02-05 MED ORDER — PROMETHAZINE HCL 25 MG/ML IJ SOLN: 6.2500 mg | INTRAMUSCULAR | Status: DC | PRN

## 2016-02-05 MED ORDER — PROPOFOL 10 MG/ML IV BOLUS
INTRAVENOUS | Status: AC
  Filled 2016-02-05: qty 20

## 2016-02-05 MED ORDER — METHOCARBAMOL 500 MG PO TABS
500.0000 mg | ORAL_TABLET | Freq: Two times a day (BID) | ORAL | Status: DC
  Administered 2016-02-05: 500 mg via ORAL
  Filled 2016-02-05: qty 1

## 2016-02-05 MED ORDER — ACETAMINOPHEN 650 MG RE SUPP: 650.0000 mg | RECTAL | Status: DC | PRN

## 2016-02-05 MED ORDER — NEOSTIGMINE METHYLSULFATE 10 MG/10ML IV SOLN
INTRAVENOUS | Status: DC | PRN
  Administered 2016-02-05: 4 mg via INTRAVENOUS

## 2016-02-05 MED ORDER — ONDANSETRON HCL 4 MG/2ML IJ SOLN: 4.0000 mg | INTRAMUSCULAR | Status: DC | PRN

## 2016-02-05 MED ORDER — OXYCODONE-ACETAMINOPHEN 5-325 MG PO TABS: 1.0000 | ORAL_TABLET | ORAL | Status: DC | PRN

## 2016-02-05 MED ORDER — OXYCODONE-ACETAMINOPHEN 5-325 MG PO TABS
1.0000 | ORAL_TABLET | ORAL | Status: DC | PRN
  Administered 2016-02-05: 1 via ORAL
  Filled 2016-02-05: qty 1

## 2016-02-05 MED ORDER — CEFAZOLIN SODIUM 1-5 GM-% IV SOLN
1.0000 g | Freq: Three times a day (TID) | INTRAVENOUS | Status: AC
  Administered 2016-02-05 – 2016-02-06 (×2): 1 g via INTRAVENOUS
  Filled 2016-02-05 (×2): qty 50

## 2016-02-05 MED ORDER — MUPIROCIN 2 % EX OINT
TOPICAL_OINTMENT | CUTANEOUS | Status: AC
  Administered 2016-02-05: 14:00:00
  Filled 2016-02-05: qty 22

## 2016-02-05 MED ORDER — THROMBIN 5000 UNITS EX SOLR
CUTANEOUS | Status: DC | PRN
  Administered 2016-02-05 (×2): 5000 [IU] via TOPICAL

## 2016-02-05 MED ORDER — HEMOSTATIC AGENTS (NO CHARGE) OPTIME
TOPICAL | Status: DC | PRN
  Administered 2016-02-05: 1 via TOPICAL

## 2016-02-05 MED ORDER — ONDANSETRON HCL 4 MG/2ML IJ SOLN
INTRAMUSCULAR | Status: AC
  Filled 2016-02-05: qty 2

## 2016-02-05 MED ORDER — DEXAMETHASONE SODIUM PHOSPHATE 10 MG/ML IJ SOLN
INTRAMUSCULAR | Status: AC
  Administered 2016-02-05: 10 mg via INTRAVENOUS
  Filled 2016-02-05: qty 1

## 2016-02-05 MED ORDER — PROPOFOL 10 MG/ML IV BOLUS
INTRAVENOUS | Status: DC | PRN
  Administered 2016-02-05: 200 mg via INTRAVENOUS

## 2016-02-05 MED ORDER — CEFAZOLIN SODIUM-DEXTROSE 2-3 GM-% IV SOLR
INTRAVENOUS | Status: AC
  Administered 2016-02-05: 2 g via INTRAVENOUS
  Filled 2016-02-05: qty 50

## 2016-02-05 MED ORDER — METHYLPREDNISOLONE SODIUM SUCC 125 MG IJ SOLR
INTRAMUSCULAR | Status: DC | PRN
  Administered 2016-02-05: 125 mg via INTRAVENOUS

## 2016-02-05 MED ORDER — ROCURONIUM BROMIDE 50 MG/5ML IV SOLN
INTRAVENOUS | Status: AC
  Filled 2016-02-05: qty 1

## 2016-02-05 MED ORDER — 0.9 % SODIUM CHLORIDE (POUR BTL) OPTIME
TOPICAL | Status: DC | PRN
  Administered 2016-02-05: 1000 mL

## 2016-02-05 MED ORDER — GLYCOPYRROLATE 0.2 MG/ML IJ SOLN
INTRAMUSCULAR | Status: DC | PRN
  Administered 2016-02-05: .6 mg via INTRAVENOUS

## 2016-02-05 MED ORDER — SODIUM CHLORIDE 0.9% FLUSH: 3.0000 mL | Freq: Two times a day (BID) | INTRAVENOUS | Status: DC

## 2016-02-05 MED ORDER — GLYCOPYRROLATE 0.2 MG/ML IJ SOLN
INTRAMUSCULAR | Status: AC
  Filled 2016-02-05: qty 3

## 2016-02-05 MED ORDER — MUPIROCIN 2 % EX OINT: 1.0000 "application " | TOPICAL_OINTMENT | Freq: Once | CUTANEOUS | Status: DC

## 2016-02-05 MED ORDER — FENTANYL CITRATE (PF) 250 MCG/5ML IJ SOLN
INTRAMUSCULAR | Status: AC
  Filled 2016-02-05: qty 5

## 2016-02-05 MED ORDER — MIDAZOLAM HCL 2 MG/2ML IJ SOLN
INTRAMUSCULAR | Status: AC
  Filled 2016-02-05: qty 2

## 2016-02-05 MED ORDER — FENTANYL CITRATE (PF) 100 MCG/2ML IJ SOLN
INTRAMUSCULAR | Status: DC | PRN
  Administered 2016-02-05: 100 ug via INTRAVENOUS
  Administered 2016-02-05 (×2): 50 ug via INTRAVENOUS

## 2016-02-05 MED ORDER — CYCLOBENZAPRINE HCL 10 MG PO TABS
10.0000 mg | ORAL_TABLET | Freq: Three times a day (TID) | ORAL | Status: DC | PRN
  Administered 2016-02-06: 10 mg via ORAL
  Filled 2016-02-05: qty 1

## 2016-02-05 MED ORDER — BUPIVACAINE HCL (PF) 0.25 % IJ SOLN
INTRAMUSCULAR | Status: DC | PRN
  Administered 2016-02-05: 10 mL

## 2016-02-05 MED ORDER — HYDROMORPHONE HCL 1 MG/ML IJ SOLN: 0.2500 mg | INTRAMUSCULAR | Status: DC | PRN

## 2016-02-05 MED ORDER — LIDOCAINE-EPINEPHRINE 1 %-1:100000 IJ SOLN
INTRAMUSCULAR | Status: DC | PRN
  Administered 2016-02-05: 10 mL

## 2016-02-05 MED ORDER — ROCURONIUM BROMIDE 100 MG/10ML IV SOLN
INTRAVENOUS | Status: DC | PRN
  Administered 2016-02-05: 50 mg via INTRAVENOUS

## 2016-02-05 MED ORDER — LIDOCAINE HCL (CARDIAC) 20 MG/ML IV SOLN
INTRAVENOUS | Status: AC
  Filled 2016-02-05: qty 5

## 2016-02-05 MED ORDER — SODIUM CHLORIDE 0.9% FLUSH: 3.0000 mL | INTRAVENOUS | Status: DC | PRN

## 2016-02-05 MED ORDER — SODIUM CHLORIDE 0.9 % IR SOLN
Status: DC | PRN
  Administered 2016-02-05: 17:00:00

## 2016-02-05 MED ORDER — HYDROMORPHONE HCL 1 MG/ML IJ SOLN: 0.5000 mg | INTRAMUSCULAR | Status: DC | PRN

## 2016-02-05 MED ORDER — LIDOCAINE HCL (CARDIAC) 20 MG/ML IV SOLN
INTRAVENOUS | Status: DC | PRN
  Administered 2016-02-05: 80 mg via INTRAVENOUS

## 2016-02-05 SURGICAL SUPPLY — 49 items
BAG DECANTER FOR FLEXI CONT (MISCELLANEOUS) ×3 IMPLANT
BENZOIN TINCTURE PRP APPL 2/3 (GAUZE/BANDAGES/DRESSINGS) ×3 IMPLANT
BLADE CLIPPER SURG (BLADE) IMPLANT
BLADE SURG 11 STRL SS (BLADE) ×3 IMPLANT
BRUSH SCRUB EZ PLAIN DRY (MISCELLANEOUS) ×3 IMPLANT
BUR MATCHSTICK NEURO 3.0 LAGG (BURR) ×3 IMPLANT
BUR PRECISION FLUTE 6.0 (BURR) ×3 IMPLANT
CANISTER SUCT 3000ML PPV (MISCELLANEOUS) ×3 IMPLANT
CLOSURE WOUND 1/2 X4 (GAUZE/BANDAGES/DRESSINGS) ×1
DECANTER SPIKE VIAL GLASS SM (MISCELLANEOUS) ×3 IMPLANT
DRAPE LAPAROTOMY 100X72X124 (DRAPES) ×3 IMPLANT
DRAPE MICROSCOPE LEICA (MISCELLANEOUS) ×3 IMPLANT
DRAPE POUCH INSTRU U-SHP 10X18 (DRAPES) ×3 IMPLANT
DRAPE PROXIMA HALF (DRAPES) IMPLANT
DRAPE SURG 17X23 STRL (DRAPES) ×3 IMPLANT
DRSG OPSITE POSTOP 4X6 (GAUZE/BANDAGES/DRESSINGS) ×3 IMPLANT
DURAPREP 26ML APPLICATOR (WOUND CARE) ×3 IMPLANT
ELECT REM PT RETURN 9FT ADLT (ELECTROSURGICAL) ×3
ELECTRODE REM PT RTRN 9FT ADLT (ELECTROSURGICAL) ×1 IMPLANT
GAUZE SPONGE 4X4 12PLY STRL (GAUZE/BANDAGES/DRESSINGS) ×3 IMPLANT
GAUZE SPONGE 4X4 16PLY XRAY LF (GAUZE/BANDAGES/DRESSINGS) IMPLANT
GLOVE BIO SURGEON STRL SZ8 (GLOVE) ×3 IMPLANT
GLOVE EXAM NITRILE LRG STRL (GLOVE) IMPLANT
GLOVE EXAM NITRILE MD LF STRL (GLOVE) IMPLANT
GLOVE EXAM NITRILE XL STR (GLOVE) IMPLANT
GLOVE EXAM NITRILE XS STR PU (GLOVE) IMPLANT
GLOVE INDICATOR 8.5 STRL (GLOVE) ×3 IMPLANT
GOWN STRL REUS W/ TWL LRG LVL3 (GOWN DISPOSABLE) ×1 IMPLANT
GOWN STRL REUS W/ TWL XL LVL3 (GOWN DISPOSABLE) ×2 IMPLANT
GOWN STRL REUS W/TWL 2XL LVL3 (GOWN DISPOSABLE) IMPLANT
GOWN STRL REUS W/TWL LRG LVL3 (GOWN DISPOSABLE) ×2
GOWN STRL REUS W/TWL XL LVL3 (GOWN DISPOSABLE) ×4
KIT BASIN OR (CUSTOM PROCEDURE TRAY) ×3 IMPLANT
KIT ROOM TURNOVER OR (KITS) ×3 IMPLANT
LIQUID BAND (GAUZE/BANDAGES/DRESSINGS) ×3 IMPLANT
NEEDLE HYPO 22GX1.5 SAFETY (NEEDLE) ×3 IMPLANT
NEEDLE SPNL 22GX3.5 QUINCKE BK (NEEDLE) ×3 IMPLANT
NS IRRIG 1000ML POUR BTL (IV SOLUTION) ×3 IMPLANT
PACK LAMINECTOMY NEURO (CUSTOM PROCEDURE TRAY) ×3 IMPLANT
RUBBERBAND STERILE (MISCELLANEOUS) ×6 IMPLANT
SPONGE SURGIFOAM ABS GEL SZ50 (HEMOSTASIS) ×3 IMPLANT
STRIP CLOSURE SKIN 1/2X4 (GAUZE/BANDAGES/DRESSINGS) ×2 IMPLANT
SUT VIC AB 0 CT1 18XCR BRD8 (SUTURE) ×1 IMPLANT
SUT VIC AB 0 CT1 8-18 (SUTURE) ×2
SUT VIC AB 2-0 CT1 18 (SUTURE) ×3 IMPLANT
SUT VICRYL 4-0 PS2 18IN ABS (SUTURE) ×3 IMPLANT
TOWEL OR 17X24 6PK STRL BLUE (TOWEL DISPOSABLE) ×3 IMPLANT
TOWEL OR 17X26 10 PK STRL BLUE (TOWEL DISPOSABLE) ×3 IMPLANT
WATER STERILE IRR 1000ML POUR (IV SOLUTION) ×3 IMPLANT

## 2016-02-05 NOTE — Anesthesia Procedure Notes (Signed)
Procedure Name: Intubation Date/Time: 02/05/2016 4:00 PM Performed by: Faustino CongressWHITE, Karina Cobb Pre-anesthesia Checklist: Patient identified, Emergency Drugs available, Suction available and Patient being monitored Patient Re-evaluated:Patient Re-evaluated prior to inductionOxygen Delivery Method: Circle system utilized Preoxygenation: Pre-oxygenation with 100% oxygen Intubation Type: IV induction Ventilation: Mask ventilation without difficulty Laryngoscope Size: Mac and 3 Grade View: Grade I Tube type: Oral Tube size: 7.0 mm Number of attempts: 1 Airway Equipment and Method: Stylet Placement Confirmation: ETT inserted through vocal cords under direct vision,  positive ETCO2 and breath sounds checked- equal and bilateral Secured at: 22 cm Tube secured with: Tape Dental Injury: Teeth and Oropharynx as per pre-operative assessment

## 2016-02-05 NOTE — Anesthesia Preprocedure Evaluation (Addendum)
Anesthesia Evaluation  Patient identified by MRN, date of birth, ID band Patient awake    Reviewed: Allergy & Precautions, NPO status , Patient's Chart, lab work & pertinent test results  History of Anesthesia Complications (+) PONV  Airway Mallampati: II  TM Distance: >3 FB Neck ROM: Full    Dental  (+) Chipped, Dental Advisory Given   Pulmonary neg pulmonary ROS,    breath sounds clear to auscultation       Cardiovascular negative cardio ROS   Rhythm:Regular Rate:Normal     Neuro/Psych Chronic back pain vertigo    GI/Hepatic Neg liver ROS, GERD  Controlled,  Endo/Other  negative endocrine ROS  Renal/GU negative Renal ROS     Musculoskeletal   Abdominal   Peds  Hematology negative hematology ROS (+)   Anesthesia Other Findings   Reproductive/Obstetrics 01/31/16 preg test NEG                          Anesthesia Physical Anesthesia Plan  ASA: II  Anesthesia Plan: General   Post-op Pain Management:    Induction: Intravenous  Airway Management Planned: Oral ETT  Additional Equipment:   Intra-op Plan:   Post-operative Plan: Extubation in OR  Informed Consent: I have reviewed the patients History and Physical, chart, labs and discussed the procedure including the risks, benefits and alternatives for the proposed anesthesia with the patient or authorized representative who has indicated his/her understanding and acceptance.   Dental advisory given  Plan Discussed with: CRNA and Surgeon  Anesthesia Plan Comments: (Plan routine monitors, GETA)        Anesthesia Quick Evaluation

## 2016-02-05 NOTE — Transfer of Care (Signed)
Immediate Anesthesia Transfer of Care Note  Patient: Karina Cobb  Procedure(s) Performed: Procedure(s): Microdiscectomy - Lumbar five-Sacral one - left (Left)  Patient Location: PACU  Anesthesia Type:General  Level of Consciousness: awake, alert , oriented and patient cooperative  Airway & Oxygen Therapy: Patient Spontanous Breathing and Patient connected to nasal cannula oxygen  Post-op Assessment: Report given to RN and Post -op Vital signs reviewed and stable  Post vital signs: Reviewed and stable  Last Vitals:  Filed Vitals:   02/05/16 1352 02/05/16 1724  BP: 128/88 141/88  Pulse: 62 58  Temp: 36.9 C 36.5 C  Resp: 20 14    Complications: No apparent anesthesia complications

## 2016-02-05 NOTE — Op Note (Signed)
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  EBL: Middle per history of present illness: Patient is a very pleasant [phone] female presented back and left leg pain that necessitated going to the ER last week we saw her at that time she had left leg radiculopathy recommend surgery she was considering this when she had an episode less than which lost control of her bladder and started feeling paresthesias in her perineum. Due to her fair conservative treatment imaging findings and progressive conical syndrome I recommended emergent laminectomy microscopic discectomy on the left at L5-S1. I extensively went over the risks and benefits of the operation the patient as well as perioperative course expectations of outcome in terms surgery and she understands and agrees to proceed forward.  Operative procedure: Patient is brought in the ER was induced on general anesthesia position prone. Amer back was prepped and draped in routine sterile fashion. Pressure localizing appropriate level so after infiltration 10 mL lidocaine with epi midline incision was made and Bovie electrocautery to was used to carry out subperiosteal dissection was carried lamina of L5 and S1 on the left. Interoperative x-ray confirmed medication proper level. In the infra-aspect of lamina L5 medial facet complex of breath aspect of lamina of S1 was removed with a Kerrison punch ligamentum flavum was removed bistro fashioned a markedly thickened and interpreted. The dura was immediately identified under proximal combination identified left S1 pedicle could visualize left S1 nerve root and S2 nerve root. Using interfocal dissected the left  S1 nerve root off of a very large free fragment discoloration several additional 5 was removed from underneath the left S1 nerve. Marking superiorly identified the was several large free fragments from the level of the space and above incised the disc space cleaned ofpituitary rongeurs and itching curettes. Again discectomy was offered a stenosis on the thecal sac or S1 or S2 nerve roots was ago to see her again fixing states was maintained coronary data was easily passed across midline and out the S1 foramen and superiorly. It was a Cobos irrigated copiously states was maintained. Gelfoam was laid tablet RN was closed in layers of Vicryl skin was closed running 4 subcuticular Dermabond benzoin and Steri-Strips were applied patient to recovery room in stable condition. At the end of case all needle, sponge counts were correct.

## 2016-02-05 NOTE — H&P (Signed)
Karina Cobb is an 35 y.o. female.   Chief Complaint: Back and left leg pain HPI: Patient is a 35 year old female who presents to the emergency Department last Sunday with back and left leg pain solid patient in my office on Monday had a large disc herniation was having back and left leg pain we recommended urgent surgery she elected to think about it however SA evening had an episode of urinary incontinence and a set frequency overnight patient called the on-call doctor was instructed to come he returns from she elected to ride it out overnight call the office is morning and I talked to come in the hospital as I was concerned that she had a cauda equina syndrome. She was experiencing paresthesias in her groin as well as pain down her left leg and at this episode of urinary incontinence and what sounded like overflow frequency. Patient become and we have prepped the patient for laminectomy and microscopic discectomy. I've extensively gone over the risks and benefits of the operation with the patient as well as perioperative course expectations of outcome and alternatives of surgery and she understands and agrees to proceed forward.  Past Medical History  Diagnosis Date  . History of bronchitis 2013  . History of migraine     last one about 2 wks ago  . History of vertigo     no meds  . Weakness     numbness and tingling,left foot  . Muscle spasm     takes Robaxin as needed  . Chronic back pain     HNP  . Acid reflux     just recently started but not on any meds  . Urinary frequency   . Urinary incontinence     rarely  . Nocturia   . Insomnia     doesn't take any meds    Past Surgical History  Procedure Laterality Date  . Mouth surgery  12/2015    Family History  Problem Relation Age of Onset  . Hypertension Father    Social History:  reports that she has never smoked. She does not have any smokeless tobacco history on file. She reports that she does not drink alcohol or use  illicit drugs.  Allergies: No Known Allergies  Medications Prior to Admission  Medication Sig Dispense Refill  . methocarbamol (ROBAXIN) 500 MG tablet Take 1 tablet (500 mg total) by mouth 2 (two) times daily. 20 tablet 0  . oxyCODONE-acetaminophen (PERCOCET) 5-325 MG tablet Take 1 tablet by mouth every 4 (four) hours as needed for severe pain. 20 tablet 0  . predniSONE (DELTASONE) 10 MG tablet Take 5 tab day 1, take 4 tab day 2, take 3 tab day 3, take 2 tab day 4, and take 1 tab day 5 15 tablet 0    Results for orders placed or performed during the hospital encounter of 02/05/16 (from the past 48 hour(s))  CBC     Status: None   Collection Time: 02/05/16  2:08 PM  Result Value Ref Range   WBC 7.3 4.0 - 10.5 K/uL   RBC 4.86 3.87 - 5.11 MIL/uL   Hemoglobin 13.6 12.0 - 15.0 g/dL   HCT 42.0 36.0 - 46.0 %   MCV 86.4 78.0 - 100.0 fL   MCH 28.0 26.0 - 34.0 pg   MCHC 32.4 30.0 - 36.0 g/dL   RDW 14.1 11.5 - 15.5 %   Platelets 228 150 - 400 K/uL  Basic metabolic panel     Status: None  Collection Time: 02/05/16  2:08 PM  Result Value Ref Range   Sodium 140 135 - 145 mmol/L   Potassium 4.6 3.5 - 5.1 mmol/L   Chloride 101 101 - 111 mmol/L   CO2 28 22 - 32 mmol/L   Glucose, Bld 85 65 - 99 mg/dL   BUN 15 6 - 20 mg/dL   Creatinine, Ser 0.94 0.44 - 1.00 mg/dL   Calcium 9.1 8.9 - 10.3 mg/dL   GFR calc non Af Amer >60 >60 mL/min   GFR calc Af Amer >60 >60 mL/min    Comment: (NOTE) The eGFR has been calculated using the CKD EPI equation. This calculation has not been validated in all clinical situations. eGFR's persistently <60 mL/min signify possible Chronic Kidney Disease.    Anion gap 11 5 - 15   No results found.  Review of Systems  Constitutional: Negative.   HENT: Negative.   Eyes: Negative.   Respiratory: Negative.   Cardiovascular: Negative.   Gastrointestinal: Negative.   Genitourinary: Positive for urgency and frequency.  Musculoskeletal: Positive for myalgias, back  pain and joint pain.  Skin: Negative.   Neurological: Positive for tingling and sensory change.    Blood pressure 128/88, pulse 62, temperature 98.4 F (36.9 C), temperature source Oral, resp. rate 20, height 5' 8"  (1.727 m), weight 82.101 kg (181 lb), last menstrual period 01/29/2016, SpO2 100 %. Physical Exam  Constitutional: She appears well-developed and well-nourished.  HENT:  Head: Normocephalic.  Eyes: Pupils are equal, round, and reactive to light.  Neck: Normal range of motion.  Cardiovascular: Normal rate.   Respiratory: Effort normal.  GI: Soft.  Musculoskeletal: Normal range of motion.  Neurological: She is alert. GCS eye subscore is 4. GCS verbal subscore is 5. GCS motor subscore is 6.  Strength is 5 out of 5 in her iliopsoas, quads, hips, gastric tolerance tibialis, and EHL on the right leg left leg strength is also 5 out of 5 except on her gastrocs which are 4+ out of 5. Sensation is decreased to S1 nerve root pattern.  Skin: Skin is warm.     Assessment/Plan 35 year old female presents for an L5-S1 left-sided laminectomy microscopic discectomy.  Cartier Mapel P, MD 02/05/2016, 3:44 PM

## 2016-02-05 NOTE — Anesthesia Postprocedure Evaluation (Signed)
Anesthesia Post Note  Patient: Karina Cobb  Procedure(s) Performed: Procedure(s) (LRB): Microdiscectomy - Lumbar five-Sacral one - left (Left)  Patient location during evaluation: PACU Anesthesia Type: General Level of consciousness: awake and alert Pain management: pain level controlled Vital Signs Assessment: post-procedure vital signs reviewed and stable Respiratory status: spontaneous breathing, nonlabored ventilation, respiratory function stable and patient connected to nasal cannula oxygen Cardiovascular status: blood pressure returned to baseline and stable Postop Assessment: no signs of nausea or vomiting Anesthetic complications: no    Last Vitals:  Filed Vitals:   02/05/16 1845 02/05/16 1904  BP: 137/86 139/83  Pulse: 51 58  Temp:  36.5 C  Resp: 9 16    Last Pain:  Filed Vitals:   02/05/16 1905  PainSc: Asleep    LLE Motor Response: Purposeful movement;Responds to commands (02/05/16 1904) LLE Sensation: Full sensation (02/05/16 1904) RLE Motor Response: Purposeful movement;Responds to commands (02/05/16 1904) RLE Sensation: Full sensation (02/05/16 1904)      Eduard ClosFITZGERALD,W. EDMOND

## 2016-02-06 DIAGNOSIS — M5117 Intervertebral disc disorders with radiculopathy, lumbosacral region: Secondary | ICD-10-CM | POA: Diagnosis not present

## 2016-02-06 MED ORDER — OXYCODONE-ACETAMINOPHEN 5-325 MG PO TABS: 1.0000 | ORAL_TABLET | ORAL | Status: AC | PRN

## 2016-02-06 MED ORDER — OXYCODONE-ACETAMINOPHEN 5-325 MG PO TABS
1.0000 | ORAL_TABLET | ORAL | Status: DC | PRN
  Administered 2016-02-06 (×2): 1 via ORAL
  Filled 2016-02-06 (×2): qty 1

## 2016-02-06 MED ORDER — METHOCARBAMOL 750 MG PO TABS: 750.0000 mg | ORAL_TABLET | Freq: Four times a day (QID) | ORAL | Status: AC

## 2016-02-06 NOTE — Evaluation (Signed)
Physical Therapy Evaluation Patient Details Name: Karina Cobb MRN: [id number] DOB: 1981-10-26 Today's Date: 02/06/2016   History of Present Illness  35 yo admitted for L5-S1 microdiscectomy. PMHx: HNP, vertigo  Clinical Impression  Pt very pleasant and moving well but limited by numbness in S1 distribution on left. Pt educated for all precautions, transfers, gait, mobility and function with handout provided. Pt will have assist of parents at home and would benefit from DME as well as OPPT follow up when cleared if deficits remain. Pt with decreased sensation, mobility and activity tolerance who will benefit from acute therapy to maximize mobility, function and gait to return pt to PLOF.     Follow Up Recommendations Outpatient PT (when cleared by MD)    Equipment Recommendations  3in1 (PT);Rolling walker with 5" wheels    Recommendations for Other Services       Precautions / Restrictions Precautions Precautions: Back Precaution Booklet Issued: Yes (comment)      Mobility  Bed Mobility Overal bed mobility: Needs Assistance Bed Mobility: Rolling;Sidelying to Sit Rolling: Supervision Sidelying to sit: Supervision       General bed mobility comments: cues for sequence and precautions  Transfers Overall transfer level: Modified independent                  Ambulation/Gait Ambulation/Gait assistance: Supervision Ambulation Distance (Feet): 350 Feet Assistive device: Rolling walker (2 wheeled);1 person hand held assist Gait Pattern/deviations: Step-to pattern;Decreased stride length;Decreased stance time - left   Gait velocity interpretation: Below normal speed for age/gender General Gait Details: pt with decreased sensation LLE and cautious gait with limited standing on LLE. Increased stability with HHA but pt hesitant to put weight through hand, increased stability and speed with use of RW  Stairs Stairs: Yes Stairs assistance: Min assist Stair Management:  Forwards;Step to pattern Number of Stairs: 2 General stair comments: hand held assist  Wheelchair Mobility    Modified Rankin (Stroke Patients Only)       Balance                                             Pertinent Vitals/Pain Pain Assessment: 0-10 Pain Score: 3  Pain Location: left leg, foot and incision Pain Descriptors / Indicators: Tingling Pain Intervention(s): Limited activity within patient's tolerance;Repositioned    Home Living Family/patient expects to be discharged to:: Private residence Living Arrangements: Parent Available Help at Discharge: Family;Available 24 hours/day Type of Home: House Home Access: Stairs to enter Entrance Stairs-Rails: None Entrance Stairs-Number of Steps: 2 Home Layout: One level Home Equipment: None      Prior Function Level of Independence: Independent               Hand Dominance        Extremity/Trunk Assessment   Upper Extremity Assessment: Overall WFL for tasks assessed           Lower Extremity Assessment: Overall WFL for tasks assessed;LLE deficits/detail   LLE Deficits / Details: pt with decreased sensation L5 nerve root but no strength deficit  Cervical / Trunk Assessment: Normal  Communication   Communication: No difficulties  Cognition Arousal/Alertness: Awake/alert Behavior During Therapy: WFL for tasks assessed/performed Overall Cognitive Status: Within Functional Limits for tasks assessed                      General Comments  Exercises        Assessment/Plan    PT Assessment Patient needs continued PT services  PT Diagnosis Difficulty walking;Acute pain   PT Problem List Decreased activity tolerance;Decreased balance;Decreased mobility;Pain  PT Treatment Interventions Gait training;Functional mobility training;Therapeutic activities;Patient/family education;DME instruction   PT Goals (Current goals can be found in the Care Plan section) Acute Rehab  PT Goals Patient Stated Goal: return to being independent, working and running PT Goal Formulation: With patient Time For Goal Achievement: 02/13/16 Potential to Achieve Goals: Good    Frequency Min 5X/week   Barriers to discharge        Co-evaluation               End of Session   Activity Tolerance: Patient tolerated treatment well Patient left: in chair;with call bell/phone within reach Nurse Communication: Mobility status;Precautions    Functional Assessment Tool Used: clinical judgement Functional Limitation: Mobility: Walking and moving around Mobility: Walking and Moving Around Current Status (Z6109): At least 1 percent but less than 20 percent impaired, limited or restricted Mobility: Walking and Moving Around Goal Status [phone]): At least 1 percent but less than 20 percent impaired, limited or restricted    Time: 0741-0805 PT Time Calculation (min) (ACUTE ONLY): 24 min   Charges:   PT Evaluation $PT Eval Moderate Complexity: 1 Procedure PT Treatments $Therapeutic Activity: 8-22 mins   PT G Codes:   PT G-Codes **NOT FOR INPATIENT CLASS** Functional Assessment Tool Used: clinical judgement Functional Limitation: Mobility: Walking and moving around Mobility: Walking and Moving Around Current Status (U9811): At least 1 percent but less than 20 percent impaired, limited or restricted Mobility: Walking and Moving Around Goal Status [phone]): At least 1 percent but less than 20 percent impaired, limited or restricted    Delorse Lek 02/06/2016, 8:48 AM Delaney Meigs, PT [phone]

## 2016-02-06 NOTE — Discharge Summary (Signed)
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 This was tolerated well. There were no postoperative complications. The patient was discharged home on postoperative day 1 in stable medical neurological condition.  Discharged Medications: Resume prior medications  Follow up: 2-3 weeks

## 2016-02-06 NOTE — Discharge Instructions (Signed)

## 2016-02-06 NOTE — Progress Notes (Signed)
Patient alert and oriented, mae's well, voiding adequate amount of urine, swallowing without difficulty, c/o mild pain and meds given prior to discharged. Patient discharged home with family. Script and discharged instructions given to patient. Patient and family stated understanding of instructions given.  

## 2016-02-06 NOTE — Evaluation (Addendum)
Occupational Therapy Evaluation Patient Details Name: Karina Cobb MRN: [id number] DOB: Sep 11, 1981 Today's Date: 02/06/2016    History of Present Illness 35 y.o. s/p  L5-S1 microdiscectomy. PMHx: vertigo, migraine, weakness, muscle spasm, chronic back pain, insomnia, nocturia, urinary frequency and incontinence, muscle spasm, acid reflux.   Clinical Impression   Pt s/p above. Education provided in session and OT signing off. Plan is for pt to d/c today.    Follow Up Recommendations  No OT follow up;Supervision - Intermittent    Equipment Recommendations  3 in 1 bedside comode;Other (comment) (AE if wanted)    Recommendations for Other Services       Precautions / Restrictions Precautions Precautions: Back;Fall Precaution Booklet Issued: No Precaution Comments: educated on back precautions      Mobility Bed Mobility   General bed mobility comments: not assessed-pt sitting EOB  Transfers Overall transfer level: Needs assistance   Transfers: Sit to/from Stand Sit to Stand: Supervision              Balance    Unsteady with ambulation-Min assist to min guard provided. Stepped over simulated tub with Min guard assist.                                        ADL Overall ADL's : Needs assistance/impaired                     Lower Body Dressing: Moderate assistance;Sit to/from stand   Toilet Transfer: Minimal assistance;Ambulation (sit to stand from bed)       Tub/ Shower Transfer: Min guard;Ambulation;Tub transfer   Functional mobility during ADLs:  (Min assist-Min guard) General ADL Comments: Discussed incorporating precautions into functional activities. Educated on AE. Educated on safety.  Educated about DME.     Vision     Perception     Praxis      Pertinent Vitals/Pain Pain Assessment: 0-10 Pain Score: 4  Pain Location: back and LLE Pain Descriptors / Indicators: Tingling Pain Intervention(s): Monitored during  session     Hand Dominance     Extremity/Trunk Assessment Upper Extremity Assessment Upper Extremity Assessment: Overall WFL for tasks assessed   Lower Extremity Assessment Lower Extremity Assessment: Defer to PT evaluation LLE Deficits / Details: pt with decreased sensation L5 nerve root but no strength deficit LLE Sensation: decreased light touch   Cervical / Trunk Assessment Cervical / Trunk Assessment: Normal   Communication Communication Communication: No difficulties   Cognition Arousal/Alertness: Awake/alert Behavior During Therapy: WFL for tasks assessed/performed Overall Cognitive Status: Within Functional Limits for tasks assessed                     General Comments       Exercises       Shoulder Instructions      Home Living Family/patient expects to be discharged to:: Private residence Living Arrangements: Parent Available Help at Discharge: Family Type of Home: House Home Access: Stairs to enter Secretary/administratorntrance Stairs-Number of Steps: 2 Entrance Stairs-Rails: None Home Layout: One level     Bathroom Shower/Tub: Chief Strategy OfficerTub/shower unit   Bathroom Toilet: Standard     Home Equipment: None          Prior Functioning/Environment Level of Independence: Independent             OT Diagnosis: Acute pain   OT Problem List: Decreased strength;Decreased range of  motion;Decreased activity tolerance;Pain;Impaired sensation;Impaired balance (sitting and/or standing)   OT Treatment/Interventions:      OT Goals(Current goals can be found in the care plan section)   OT Frequency:   Barriers to D/C:            Co-evaluation              End of Session Equipment Utilized During Treatment: Gait belt;Other (comment) (AE) Nurse Communication: Other (comment) (recommending 3 in 1)  Activity Tolerance: Patient tolerated treatment well Patient left: in bed;with family/visitor present (sitting EOB)   Time: 1610-9604 OT Time Calculation (min): 19  min Charges:  OT General Charges $OT Visit: 1 Procedure OT Evaluation $OT Eval Moderate Complexity: 1 Procedure G-Codes: OT G-codes **NOT FOR INPATIENT CLASS** Functional Assessment Tool Used: clinical judgment Functional Limitation: Self care Self Care Current Status (V4098): At least 20 percent but less than 40 percent impaired, limited or restricted Self Care Goal Status (J1914): At least 20 percent but less than 40 percent impaired, limited or restricted Self Care Discharge Status [phone]): At least 20 percent but less than 40 percent impaired, limited or restricted  Earlie Raveling OTR/L [phone] 02/06/2016, 11:12 AM

## 2016-02-08 ENCOUNTER — Encounter (HOSPITAL_COMMUNITY): Payer: Self-pay | Admitting: Neurosurgery

## 2017-11-26 IMAGING — CR DG LUMBAR SPINE 2-3V
2 series · 2 of 2 positions shown · non-contrast
Comparison: Two cross-table lateral views obtained intraoperatively
are compared to the prior MRI and radiographs of 01/31/2016

CLINICAL DATA: Microdiskectomy L5-S1 LEFT

EXAM:
LUMBAR SPINE - 2-3 VIEW

[lat (1 of 2)]
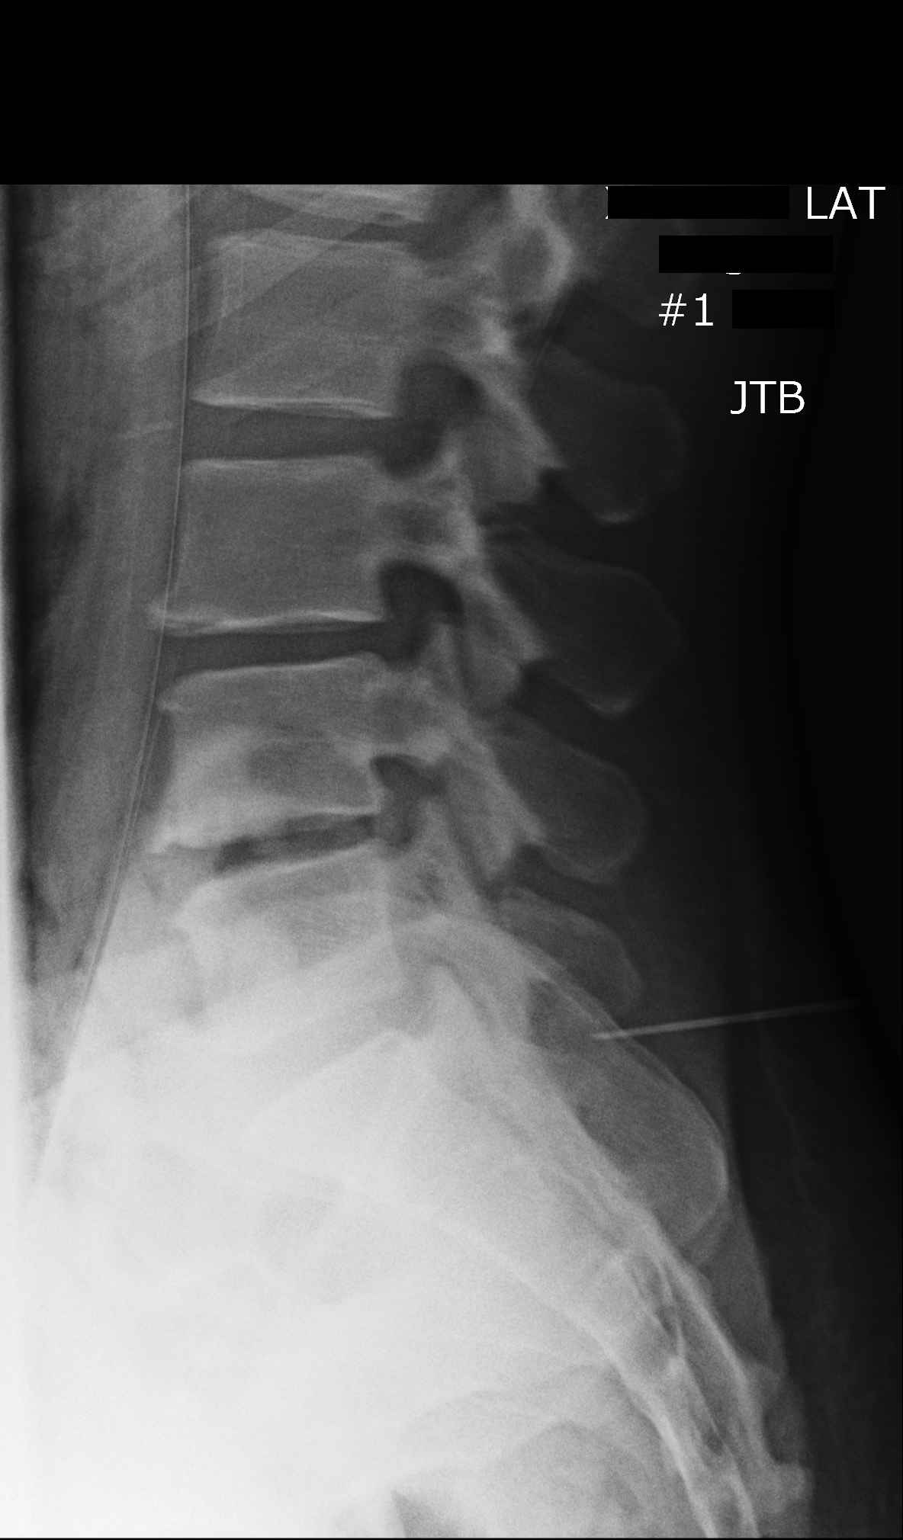

[lat (2 of 2)]
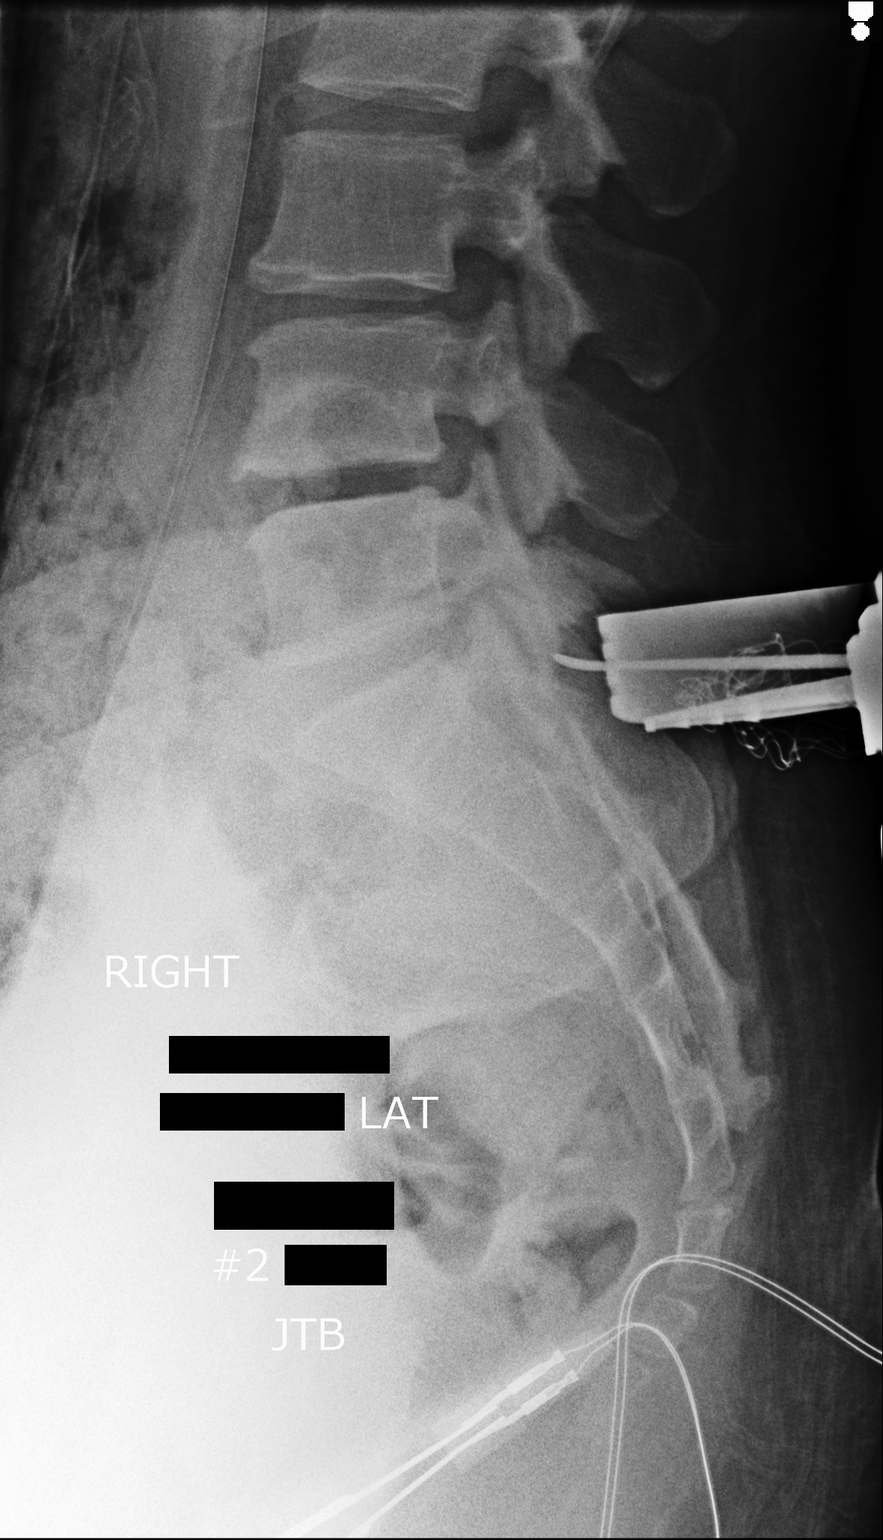

[2 of 2 positions shown; findings below may reference images not displayed]

FINDINGS: Prior lumbar spine MR labeled with 5 lumbar vertebra ; current
radiographs labeled accordingly.

Image #1 1835 hours: Limited by motion. Metallic probe via dorsal
approach projects dorsal to the L5-S1 disc space, just below the tip
of the spinous process of L5. Disc space narrowing L3-L4 and L4-L5
with small endplate spurs.

Image #2 at 7774 hours: Curve metallic probe via dorsal approach
projects dorsal to the L5-S1 disc space. Tissue spreader and sponge
also present. Bones otherwise unchanged.
IMPRESSION: Dorsal localization of the L5-S1 disc space level as above.
